# Patient Record
Sex: Female | Born: 1955 | Race: White | Hispanic: No | Marital: Married | Smoking: Former smoker
Health system: Southern US, Community
[De-identification: ages and names within clinical notes are randomized; demographics above are authoritative.]

## PROBLEM LIST (undated history)

## (undated) DIAGNOSIS — I471 Supraventricular tachycardia, unspecified: Secondary | ICD-10-CM

## (undated) DIAGNOSIS — G8929 Other chronic pain: Secondary | ICD-10-CM

## (undated) DIAGNOSIS — E039 Hypothyroidism, unspecified: Secondary | ICD-10-CM

## (undated) DIAGNOSIS — M329 Systemic lupus erythematosus, unspecified: Secondary | ICD-10-CM

## (undated) DIAGNOSIS — IMO0002 Reserved for concepts with insufficient information to code with codable children: Secondary | ICD-10-CM

## (undated) DIAGNOSIS — F410 Panic disorder [episodic paroxysmal anxiety] without agoraphobia: Secondary | ICD-10-CM

## (undated) DIAGNOSIS — M810 Age-related osteoporosis without current pathological fracture: Secondary | ICD-10-CM

## (undated) HISTORY — DX: Supraventricular tachycardia: I47.1

## (undated) HISTORY — DX: Supraventricular tachycardia, unspecified: I47.10

## (undated) HISTORY — DX: Hypothyroidism, unspecified: E03.9

---

## 1996-10-16 HISTORY — PX: LUMBAR LAMINECTOMY: SHX95

## 2000-10-16 HISTORY — PX: CERVICAL FUSION: SHX112

## 2008-05-28 ENCOUNTER — Encounter (INDEPENDENT_AMBULATORY_CARE_PROVIDER_SITE_OTHER): Payer: Self-pay | Admitting: *Deleted

## 2008-05-28 LAB — CONVERTED CEMR LAB: Pap Smear: NORMAL

## 2008-05-29 ENCOUNTER — Encounter (INDEPENDENT_AMBULATORY_CARE_PROVIDER_SITE_OTHER): Payer: Self-pay | Admitting: *Deleted

## 2008-06-05 ENCOUNTER — Ambulatory Visit: Payer: Self-pay | Admitting: *Deleted

## 2008-06-05 DIAGNOSIS — M1990 Unspecified osteoarthritis, unspecified site: Secondary | ICD-10-CM | POA: Insufficient documentation

## 2008-06-05 DIAGNOSIS — E039 Hypothyroidism, unspecified: Secondary | ICD-10-CM | POA: Insufficient documentation

## 2008-06-05 DIAGNOSIS — F341 Dysthymic disorder: Secondary | ICD-10-CM | POA: Insufficient documentation

## 2008-06-05 DIAGNOSIS — F411 Generalized anxiety disorder: Secondary | ICD-10-CM | POA: Insufficient documentation

## 2008-06-05 DIAGNOSIS — J309 Allergic rhinitis, unspecified: Secondary | ICD-10-CM | POA: Insufficient documentation

## 2008-06-05 DIAGNOSIS — E785 Hyperlipidemia, unspecified: Secondary | ICD-10-CM | POA: Insufficient documentation

## 2008-06-05 DIAGNOSIS — K219 Gastro-esophageal reflux disease without esophagitis: Secondary | ICD-10-CM | POA: Insufficient documentation

## 2008-06-05 DIAGNOSIS — Z87442 Personal history of urinary calculi: Secondary | ICD-10-CM | POA: Insufficient documentation

## 2008-06-05 DIAGNOSIS — I10 Essential (primary) hypertension: Secondary | ICD-10-CM | POA: Insufficient documentation

## 2008-06-05 DIAGNOSIS — G894 Chronic pain syndrome: Secondary | ICD-10-CM | POA: Insufficient documentation

## 2008-06-05 DIAGNOSIS — F329 Major depressive disorder, single episode, unspecified: Secondary | ICD-10-CM | POA: Insufficient documentation

## 2008-06-05 DIAGNOSIS — N259 Disorder resulting from impaired renal tubular function, unspecified: Secondary | ICD-10-CM | POA: Insufficient documentation

## 2008-06-08 ENCOUNTER — Telehealth (INDEPENDENT_AMBULATORY_CARE_PROVIDER_SITE_OTHER): Payer: Self-pay | Admitting: *Deleted

## 2008-10-19 ENCOUNTER — Ambulatory Visit: Payer: Self-pay | Admitting: *Deleted

## 2008-10-19 DIAGNOSIS — K5900 Constipation, unspecified: Secondary | ICD-10-CM | POA: Insufficient documentation

## 2008-10-19 DIAGNOSIS — D509 Iron deficiency anemia, unspecified: Secondary | ICD-10-CM | POA: Insufficient documentation

## 2008-10-19 LAB — CONVERTED CEMR LAB
ALT: 15 units/L (ref 0–35)
Albumin: 3.6 g/dL (ref 3.5–5.2)
Alkaline Phosphatase: 101 units/L (ref 39–117)
CO2: 31 meq/L (ref 19–32)
GFR calc non Af Amer: 81 mL/min
Glucose, Bld: 106 mg/dL — ABNORMAL HIGH (ref 70–99)
Potassium: 3.8 meq/L (ref 3.5–5.1)
Sodium: 139 meq/L (ref 135–145)
Total Protein: 6.4 g/dL (ref 6.0–8.3)

## 2008-11-16 ENCOUNTER — Telehealth (INDEPENDENT_AMBULATORY_CARE_PROVIDER_SITE_OTHER): Payer: Self-pay | Admitting: *Deleted

## 2008-12-24 ENCOUNTER — Telehealth (INDEPENDENT_AMBULATORY_CARE_PROVIDER_SITE_OTHER): Payer: Self-pay | Admitting: *Deleted

## 2009-02-26 ENCOUNTER — Ambulatory Visit: Payer: Self-pay | Admitting: Internal Medicine

## 2009-02-26 DIAGNOSIS — L0390 Cellulitis, unspecified: Secondary | ICD-10-CM

## 2009-02-26 DIAGNOSIS — L0291 Cutaneous abscess, unspecified: Secondary | ICD-10-CM | POA: Insufficient documentation

## 2009-02-26 DIAGNOSIS — L259 Unspecified contact dermatitis, unspecified cause: Secondary | ICD-10-CM | POA: Insufficient documentation

## 2009-03-01 ENCOUNTER — Encounter: Payer: Self-pay | Admitting: Internal Medicine

## 2009-03-12 ENCOUNTER — Telehealth: Payer: Self-pay | Admitting: Internal Medicine

## 2009-03-23 ENCOUNTER — Ambulatory Visit: Payer: Self-pay | Admitting: Internal Medicine

## 2009-06-03 ENCOUNTER — Encounter: Payer: Self-pay | Admitting: Internal Medicine

## 2009-06-04 ENCOUNTER — Encounter: Payer: Self-pay | Admitting: Internal Medicine

## 2009-06-09 ENCOUNTER — Encounter: Payer: Self-pay | Admitting: Internal Medicine

## 2009-06-11 ENCOUNTER — Encounter: Payer: Self-pay | Admitting: Internal Medicine

## 2009-06-15 ENCOUNTER — Telehealth: Payer: Self-pay | Admitting: Internal Medicine

## 2009-06-15 ENCOUNTER — Ambulatory Visit: Payer: Self-pay | Admitting: Radiology

## 2009-06-15 ENCOUNTER — Ambulatory Visit: Payer: Self-pay | Admitting: Internal Medicine

## 2009-06-15 ENCOUNTER — Ambulatory Visit (HOSPITAL_BASED_OUTPATIENT_CLINIC_OR_DEPARTMENT_OTHER): Admission: RE | Admit: 2009-06-15 | Discharge: 2009-06-15 | Payer: Self-pay | Admitting: Internal Medicine

## 2009-06-23 ENCOUNTER — Telehealth: Payer: Self-pay | Admitting: Internal Medicine

## 2009-10-06 ENCOUNTER — Encounter (INDEPENDENT_AMBULATORY_CARE_PROVIDER_SITE_OTHER): Payer: Self-pay | Admitting: *Deleted

## 2009-10-22 ENCOUNTER — Encounter (INDEPENDENT_AMBULATORY_CARE_PROVIDER_SITE_OTHER): Payer: Self-pay | Admitting: *Deleted

## 2010-11-15 NOTE — Letter (Signed)
Summary: Pre Visit No Show Letter  [facility]  [address] [place], [place] [address]   Phone: [phone]  Fax: [phone]        October 22, 2009 MRN: [id number]    [facility] [address] [place], [place]  [address]    Dear Ms. Warnick,   We have been unable to reach you by phone concerning the pre-procedure visit that you missed on 10/22/09. For this reason,your procedure scheduled on 11/05/09 has been cancelled. Our scheduling staff will gladly assist you with rescheduling your appointments at a more convenient time. Please call our office at [phone] between the hours of 8:00am and 5:00pm, press option #2 to reach an appointment scheduler. Please consider updating your contact numbers at this time so that we can reach you by phone in the future with schedule changes or results.    Thank you,    Wyona Almas RN [facility]

## 2013-08-27 ENCOUNTER — Ambulatory Visit (INDEPENDENT_AMBULATORY_CARE_PROVIDER_SITE_OTHER): Payer: Managed Care, Other (non HMO) | Admitting: Neurology

## 2013-08-27 ENCOUNTER — Encounter: Payer: Self-pay | Admitting: Neurology

## 2013-08-27 VITALS — BP 132/80 | HR 88 | Temp 98.0°F | Ht 65.0 in | Wt 147.7 lb

## 2013-08-27 DIAGNOSIS — M542 Cervicalgia: Secondary | ICD-10-CM

## 2013-08-27 NOTE — Patient Instructions (Addendum)
1.  MRI cervical spine wo contrast-Thursday November 20,2015 at 12:45 to arrive at 12:30 at [facility] [id number]. 2.  Recommended pain management for symptomatic control since she has been on a number of medications without benefit 3.  Physical therapy recommended, but patient deferred  4.  Return to clinic in 4-months

## 2013-08-27 NOTE — Progress Notes (Signed)
[facility] Clinic Note - Initial Visit   Date: 08/27/2013    Julia Stout MRN: [id number] DOB: 07/24/1956   Dear Dr Aundria Rud:  Thank you for your kind referral of Julia Stout for consultation of back and neck pain. Although her history is well known to you, please allow [place] to reiterate it for the purpose of our medical record. The patient was accompanied to the clinic by self.   History of Present Illness: Julia Stout is a 57 y.o. year-old right-handed Caucasian female with history of hypothyroidism,SVT s/p ablation, cervical fusion at C5-6 and C6-7 (2001) and lumbar laminectomy (1999) presenting for evaluation of neck pain.   She tells me that she has always had neck pain and in 2001, had a double cervical fusion at C5-6 and C6-7, but had hardware removed a few weeks later because of complications related to a vertebral fracture.  Following surgery, there was mild improvement in pain and she and was able to work go back to work while taking flexiril and vicodin, but went on disability in 2004 because of persistent neck pain. She saw a number of providers because of worsening pain.  Her last spine imaging from 2007 showed post-surgical changes and moderate foraminal narrowing at C4-5 (left) and C3-4 and C5-6 (right).  EMG in 2007 was normal.  Since then, she had been managing her pain with oxycodone but in 2008 started developing open lesions of her face.  She has been to over 20 physicians for these lesions and was placed on antibiotics. She was told she was "erupting oxycodone out of her face" so stopped it.  Since then, nothing has helped with her pain.     Currently, she has intermittent neck pain, described as sharp and achy.  Pain is worsened by any type of activity such as dusting, vacuuming exacerbate symptoms.  Currently, she takes lyrica and tramadol for pain.  Previously, she has taking oxycodone, neurontin, advil, cymbalta, flexiril and had a number  of steroid injections.  She has been to neurologist Dr. Merita Norton in [place], Dr. Jamse Belfast at [facility], Dr. Alois Cliche at [facility], and currently follows with her PCP, Dr. Osborne Oman at [facility].  For pain management, she is taking ultram, advil, and Lyrica 200mg  BID.  She had done physical therapy in the past but said it made her worse.  Out-side paper records, electronic medical record, and images have been reviewed where available and summarized as: EMG 2007:  Normal study of the right side and left lower extremity.  MRI lumbar spine wwo contrast 01/16/2006: 1.  S/p midline L5 laminectomy.  At L5-S1, there is degenerative disc changes without focal herniation. 2.  At the superior anterior border of the L3 vertebral body, there are mild signal changes with peripheral enhancement, but no significant degenerative disc changes.  Findings likely represent subchondral reactive changes   MRI thoracic spine wwo contrast 01/16/2006 Normal MRI of the thoracic spine  MRI cervical spine wwo contrast 01/16/2006: 1.  S/p stable C6-7 spinal fusion 2.  At C5-6, vertebral and intervertebral disc space changes most suggestive surgical diskectomy at that site.   3.  Degenerative bone and disc changes present at C3-C4, C4-5, and C5-6.  There changes results in mild central canal stenosis and moderate left foraminal narrowing at C4-5 as well as moderate right foraminal narrowing at C3-4 and C5-6. 4.  No focal disc herniations.   Past Medical History  Diagnosis Date  . SVT (supraventricular  tachycardia)   . Hypothyroid     Past Surgical History  Procedure Laterality Date  . Lumbar laminectomy  1998    L5-S1  . Cervical fusion  2002     Medications:  No current outpatient prescriptions on file prior to visit.   No current facility-administered medications on file prior to visit.    Allergies:  Allergies  Allergen Reactions  . Neomycin-Bacitracin  Zn-Polymyx     REACTION: Blsiters  . Penicillins   . Sulfonamide Derivatives     Family History: Family History  Problem Relation Age of Onset  . Hypertension Mother     Living, 80  . Arthritis Father     Living, 30  . Bowel Disease Sister     Died, 31 from SBO  . Crohn's disease Brother     Living, 59    Social History: History   Social History  . Marital Status: Married    Spouse Name: N/A    Number of Children: N/A  . Years of Education: N/A   Occupational History  . Not on file.   Social History Main Topics  . Smoking status: Former Games developer  . Smokeless tobacco: Never Used  . Alcohol Use: No  . Drug Use: No  . Sexual Activity: Not on file   Other Topics Concern  . Not on file   Social History Narrative   She previously worked as an Charity fundraiser.     She has been on disability since 2004 because of low back pain.   Lives with husband.    Review of Systems:  CONSTITUTIONAL: No fevers, chills, night sweats, or weight loss.   EYES: No visual changes or eye pain ENT: No hearing changes.  No history of nose bleeds.  +neck pain RESPIRATORY: No cough, wheezing and shortness of breath.   CARDIOVASCULAR: Negative for chest pain, and palpitations.   GI: Negative for abdominal discomfort, blood in stools or black stools.  No recent change in bowel habits.   GU:  No history of incontinence.   MUSCLOSKELETAL: No history of joint pain or swelling.  No myalgias.   SKIN: Negative for lesions, rash, and itching.   HEMATOLOGY/ONCOLOGY: Negative for prolonged bleeding, bruising easily, and swollen nodes.  No history of cancer.   ENDOCRINE: Negative for cold or heat intolerance, polydipsia or goiter.   PSYCH:  No depression +anxiety symptoms.   NEURO: As Above.   Vital Signs:  BP 132/80  Pulse 88  Temp(Src) 98 F (36.7 C) (Oral)  Ht 5\' 5"  (1.651 m)  Wt 147 lb 11.2 oz (66.996 kg)  BMI 24.58 kg/m2  Neurological Exam: MENTAL STATUS including orientation to time, place,  person, recent and remote memory, attention span and concentration, language, and fund of knowledge is normal.  Speech is not dysarthric.  CRANIAL NERVES: II:  No visual field defects.  Unremarkable fundi.   III-IV-VI: Pupils equal round and reactive to light.  Normal conjugate, extra-ocular eye movements in all directions of gaze.  No nystagmus.  No ptosis.   V:  Normal facial sensation.   VII:  Normal facial symmetry and movements.  VIII:  Normal hearing and vestibular function.   IX-X:  Normal palatal movement.   XI:  Normal shoulder shrug and head rotation.   XII:  Normal tongue strength and range of motion, no deviation or fasciculation.  MOTOR:  No atrophy, fasciculations or abnormal movements.  No pronator drift.  Tone is normal.    Right Upper Extremity:  Left Upper Extremity:    Deltoid  5/5   Deltoid  5/5   Biceps  5/5   Biceps  5/5   Triceps  5/5   Triceps  5/5   Wrist extensors  5/5   Wrist extensors  5/5   Wrist flexors  5/5   Wrist flexors  5/5   Finger extensors  5/5   Finger extensors  5/5   Finger flexors  5/5   Finger flexors  5/5   Dorsal interossei  5/5   Dorsal interossei  5/5   Abductor pollicis  5/5   Abductor pollicis  5/5   Tone (Ashworth scale)  0  Tone (Ashworth scale)  0   Right Lower Extremity:    Left Lower Extremity:    Hip flexors  5/5   Hip flexors  5/5   Hip extensors  5/5   Hip extensors  5/5   Knee flexors  5/5   Knee flexors  5/5   Knee extensors  5/5   Knee extensors  5/5   Dorsiflexors  5/5   Dorsiflexors  5/5   Plantarflexors  5/5   Plantarflexors  5/5   Toe extensors  5/5   Toe extensors  5/5   Toe flexors  5/5   Toe flexors  5/5   Tone (Ashworth scale)  0  Tone (Ashworth scale)  0   MSRs:  Right                                                                 Left brachioradialis 2+  brachioradialis 2+  biceps 2+  biceps 2+  triceps 2+  triceps 2+  patellar 2+  patellar 2+  ankle jerk 2+  ankle jerk 2+  Hoffman no  Hoffman no    plantar response down  plantar response down  No clonus.  SENSORY:  Patchy area of diminished pin prick over the right lateral lower leg, otherwise normal and symmetric perception of light touch, vibration, and proprioception.  Romberg's sign absent.   COORDINATION/GAIT: Normal finger-to- nose-finger and heel-to-shin.  Intact rapid alternating movements bilaterally.  Able to rise from a chair without using arms.  Gait narrow based and stable. Slightly unsteady with tandem and stressed gait intact.    IMPRESSION: Ms. Eugenio is a 57 year-old female presenting for evaluation of chronic neck pain.  She had no weakness on exam and only a discrete area of reduced pin prick over the right lateral leg.  I have no images to review, but her MRI report from 2007 indicate post-surgical changes and moderate foraminal narrowing at C4-5 (left) and C3-4 and C5-6 (right).  I discussed that given her worsening pain, we can reassess the degree of nerve impingement with repeat imaging.  Further, EMG of the upper extremities can be performed to better localize and determine the severity of nerve involvement.  She is not interesting in a EMG, but is very interested in repeat imaging of her neck.   PLAN/RECOMMENDATIONS:  1.  MRI cervical spine wo contrast 2.  Physical therapy recommended, but patient declined 3.  Return to clinic in 37-months  The duration of this appointment visit was 40 minutes of face-to-face time with the patient.  Greater than 50% of this time was spent in  counseling, explanation of diagnosis, planning of further management, and coordination of care.   Thank you for allowing me to participate in patient's care.  If I can answer any additional questions, I would be pleased to do so.    Sincerely,    Salwa Bai K. Posey Pronto, DO

## 2013-09-04 ENCOUNTER — Inpatient Hospital Stay: Admission: RE | Admit: 2013-09-04 | Payer: Managed Care, Other (non HMO) | Source: Ambulatory Visit

## 2013-09-09 ENCOUNTER — Telehealth: Payer: Self-pay | Admitting: Neurology

## 2013-09-09 ENCOUNTER — Encounter: Payer: Self-pay | Admitting: Neurology

## 2013-09-09 ENCOUNTER — Other Ambulatory Visit: Payer: Self-pay | Admitting: *Deleted

## 2013-09-09 DIAGNOSIS — M542 Cervicalgia: Secondary | ICD-10-CM

## 2013-09-09 NOTE — Telephone Encounter (Signed)
Pt calling back, are we going to start PT? See previous telephone message / Roanna Raider

## 2013-09-09 NOTE — Telephone Encounter (Signed)
Pt called the office yesterday and spoke w/ Annabelle Harman. Pt says she wants a MRI and is upset b/c her insurance will not approve the procedure. Pt repeatedly cursed towards [place]. Pt states she has been doing PT since she was 57 yrs old and it doesn't help. Throughout the phone call, the patient continued to curse. Annabelle Harman asked the patient to refrain from using this type of language on the phone. In speaking with Dr. Allena Katz, Dr. Allena Katz has advised this patient will be dismissed from our practice due to the behavior and language exhibited towards our staff on the phone. I have sent a Patient Dismissal Form to Dr. Allena Katz for review. Once she has approved and returned the form to me, I will process the dismissal / Oneita Kras. [facility]

## 2013-09-09 NOTE — Telephone Encounter (Signed)
Referral made [facility] outpatient PT department

## 2013-09-09 NOTE — Telephone Encounter (Signed)
Patient verbally abuse with front staff over the phone on 11/24.  She called back yesterday and today apologizing for her behavior and is now interested in doing PT.   I will continue to see her for now as long as she understands that our office will not tolerate unprofessional behavior and abusive language to staff member.  If she does not comply, she will be discharged from [facility].  For now, we will arrange for her to be seen for PT.  Donika K. Allena Katz, DO

## 2013-09-10 NOTE — Telephone Encounter (Signed)
Spoke with patient.

## 2013-09-10 NOTE — Telephone Encounter (Signed)
LM for pt to call for appt / Sherri

## 2013-09-10 NOTE — Telephone Encounter (Signed)
Spoke w/ pt, says she would like appt sooner than Jan. She says she is having a lot of pain. This sounds like a different pain than she was initially seen here for in the past. Alcario Drought - you may want to call her and get additional details for Dr. Allena Katz regarding current pain. We do not have immediate openings w/ Dr. Allena Katz. Please call / Sherri S.

## 2013-09-23 ENCOUNTER — Telehealth: Payer: Self-pay

## 2013-09-23 NOTE — Telephone Encounter (Signed)
Pt called to discuss a couple of issues going on with her. First she stated that " Fluid is coming out of her face, ears and an old incision on her neck. Pouring out." She is very upset and scared by this and feels that something is very wrong with her. Secondly, she said that she can not do outpatient PT because she can't drive or leave her house. I told her we can put in an order for home health PT but she was very reluctant and said " I know all the moves they will tell me to do and it doesn't help me." she also said that the condition she is in right now with the fluid leakage she is unable to do PT. Thirdly, she said that she " wants to get a referral for a different Neurologist that is an old man." And lastly, she said that she had given Dr.Patel a document that was not returned to her. We are unaware of this document. Pt is going to call her PCP to get a new referral for a different Neurologist.

## 2013-11-04 ENCOUNTER — Ambulatory Visit: Payer: Managed Care, Other (non HMO) | Admitting: Neurology

## 2013-11-07 ENCOUNTER — Telehealth: Payer: Self-pay | Admitting: Neurology

## 2013-11-07 NOTE — Telephone Encounter (Signed)
Patient dismissed from [facility] by Narda Amber D.O. , effective September 09, 2013. Dismissal letter sent out by certified / registered mail. DAJ

## 2014-07-04 ENCOUNTER — Emergency Department (HOSPITAL_BASED_OUTPATIENT_CLINIC_OR_DEPARTMENT_OTHER)
Admission: EM | Admit: 2014-07-04 | Discharge: 2014-07-04 | Disposition: A | Discharge status: Home / Self Care | Payer: Managed Care, Other (non HMO) | Attending: Emergency Medicine | Admitting: Emergency Medicine

## 2014-07-04 ENCOUNTER — Encounter (HOSPITAL_BASED_OUTPATIENT_CLINIC_OR_DEPARTMENT_OTHER): Payer: Self-pay | Admitting: Emergency Medicine

## 2014-07-04 ENCOUNTER — Emergency Department (HOSPITAL_BASED_OUTPATIENT_CLINIC_OR_DEPARTMENT_OTHER): Payer: Managed Care, Other (non HMO)

## 2014-07-04 DIAGNOSIS — S72109A Unspecified trochanteric fracture of unspecified femur, initial encounter for closed fracture: Secondary | ICD-10-CM | POA: Diagnosis not present

## 2014-07-04 DIAGNOSIS — Y929 Unspecified place or not applicable: Secondary | ICD-10-CM | POA: Diagnosis not present

## 2014-07-04 DIAGNOSIS — S199XXA Unspecified injury of neck, initial encounter: Secondary | ICD-10-CM | POA: Diagnosis present

## 2014-07-04 DIAGNOSIS — Y9389 Activity, other specified: Secondary | ICD-10-CM | POA: Insufficient documentation

## 2014-07-04 DIAGNOSIS — W010XXA Fall on same level from slipping, tripping and stumbling without subsequent striking against object, initial encounter: Secondary | ICD-10-CM | POA: Insufficient documentation

## 2014-07-04 DIAGNOSIS — IMO0002 Reserved for concepts with insufficient information to code with codable children: Secondary | ICD-10-CM | POA: Diagnosis not present

## 2014-07-04 DIAGNOSIS — Z79899 Other long term (current) drug therapy: Secondary | ICD-10-CM | POA: Diagnosis not present

## 2014-07-04 DIAGNOSIS — S0993XA Unspecified injury of face, initial encounter: Secondary | ICD-10-CM | POA: Diagnosis present

## 2014-07-04 DIAGNOSIS — S72112A Displaced fracture of greater trochanter of left femur, initial encounter for closed fracture: Secondary | ICD-10-CM

## 2014-07-04 DIAGNOSIS — E039 Hypothyroidism, unspecified: Secondary | ICD-10-CM | POA: Diagnosis not present

## 2014-07-04 DIAGNOSIS — Z87891 Personal history of nicotine dependence: Secondary | ICD-10-CM | POA: Diagnosis not present

## 2014-07-04 DIAGNOSIS — Z880 Allergy status to penicillin: Secondary | ICD-10-CM | POA: Insufficient documentation

## 2014-07-04 DIAGNOSIS — Z792 Long term (current) use of antibiotics: Secondary | ICD-10-CM | POA: Insufficient documentation

## 2014-07-04 DIAGNOSIS — Z8679 Personal history of other diseases of the circulatory system: Secondary | ICD-10-CM | POA: Insufficient documentation

## 2014-07-04 MED ORDER — HYDROMORPHONE HCL 1 MG/ML IJ SOLN
1.0000 mg | Freq: Once | INTRAMUSCULAR | Status: AC
  Administered 2014-07-04: 1 mg via INTRAMUSCULAR
  Filled 2014-07-04: qty 1

## 2014-07-04 MED ORDER — SODIUM CHLORIDE 0.9 % IV SOLN: Freq: Once | INTRAVENOUS | Status: DC

## 2014-07-04 MED ORDER — HYDROMORPHONE HCL 1 MG/ML IJ SOLN
0.5000 mg | Freq: Once | INTRAMUSCULAR | Status: AC
  Administered 2014-07-04: 0.5 mg via INTRAMUSCULAR
  Filled 2014-07-04: qty 1

## 2014-07-04 MED ORDER — HYDROCODONE-ACETAMINOPHEN 5-325 MG PO TABS
1.0000 | ORAL_TABLET | ORAL | Status: DC | PRN
Start: 2014-07-04 — End: 2016-04-15

## 2014-07-04 MED ORDER — HYDROMORPHONE HCL 1 MG/ML IJ SOLN: 1.0000 mg | Freq: Once | INTRAMUSCULAR | Status: DC

## 2014-07-04 NOTE — Consult Note (Signed)
Called by PA at med center [place] about patient S/p fall yesterday with hip pain and difficulty ambulating CT scan results noted No evidence of femoral neck/intertroch disruption Patient by report is NVI, no other complaints.  Closed injury Plan:  NWB  F/u this week with my partner Dr Dois Davenport for ongoing non-operative fracture management  Pain control per ER staff

## 2014-07-04 NOTE — Discharge Instructions (Signed)
IT IS ESSENTIAL THAT YOU REMAIN NON-WEIGHT BEARING TO THE LEFT HIP AT ALL TIMES. FOLLOW UP WITH DR. Gladstone Lighter NEXT WEEK FOR FURTHER MANAGEMENT OF HIP INJURY. RETURN TO THE ED AS NEEDED.   Cryotherapy Cryotherapy means treatment with cold. Ice or gel packs can be used to reduce both pain and swelling. Ice is the most helpful within the first 24 to 48 hours after an injury or flare-up from overusing a muscle or joint. Sprains, strains, spasms, burning pain, shooting pain, and aches can all be eased with ice. Ice can also be used when recovering from surgery. Ice is effective, has very few side effects, and is safe for most people to use. PRECAUTIONS  Ice is not a safe treatment option for people with:  Raynaud phenomenon. This is a condition affecting small blood vessels in the extremities. Exposure to cold may cause your problems to return.  Cold hypersensitivity. There are many forms of cold hypersensitivity, including:  Cold urticaria. Red, itchy hives appear on the skin when the tissues begin to warm after being iced.  Cold erythema. This is a red, itchy rash caused by exposure to cold.  Cold hemoglobinuria. Red blood cells break down when the tissues begin to warm after being iced. The hemoglobin that carry oxygen are passed into the urine because they cannot combine with blood proteins fast enough.  Numbness or altered sensitivity in the area being iced. If you have any of the following conditions, do not use ice until you have discussed cryotherapy with your caregiver:  Heart conditions, such as arrhythmia, angina, or chronic heart disease.  High blood pressure.  Healing wounds or open skin in the area being iced.  Current infections.  Rheumatoid arthritis.  Poor circulation.  Diabetes. Ice slows the blood flow in the region it is applied. This is beneficial when trying to stop inflamed tissues from spreading irritating chemicals to surrounding tissues. However, if you expose  your skin to cold temperatures for too long or without the proper protection, you can damage your skin or nerves. Watch for signs of skin damage due to cold. HOME CARE INSTRUCTIONS Follow these tips to use ice and cold packs safely.  Place a dry or damp towel between the ice and skin. A damp towel will cool the skin more quickly, so you may need to shorten the time that the ice is used.  For a more rapid response, add gentle compression to the ice.  Ice for no more than 10 to 20 minutes at a time. The bonier the area you are icing, the less time it will take to get the benefits of ice.  Check your skin after 5 minutes to make sure there are no signs of a poor response to cold or skin damage.  Rest 20 minutes or more between uses.  Once your skin is numb, you can end your treatment. You can test numbness by very lightly touching your skin. The touch should be so light that you do not see the skin dimple from the pressure of your fingertip. When using ice, most people will feel these normal sensations in this order: cold, burning, aching, and numbness.  Do not use ice on someone who cannot communicate their responses to pain, such as small children or people with dementia. HOW TO MAKE AN ICE PACK Ice packs are the most common way to use ice therapy. Other methods include ice massage, ice baths, and cryosprays. Muscle creams that cause a cold, tingly feeling do not  offer the same benefits that ice offers and should not be used as a substitute unless recommended by your caregiver. To make an ice pack, do one of the following:  Place crushed ice or a bag of frozen vegetables in a sealable plastic bag. Squeeze out the excess air. Place this bag inside another plastic bag. Slide the bag into a pillowcase or place a damp towel between your skin and the bag.  Mix 3 parts water with 1 part rubbing alcohol. Freeze the mixture in a sealable plastic bag. When you remove the mixture from the freezer, it  will be slushy. Squeeze out the excess air. Place this bag inside another plastic bag. Slide the bag into a pillowcase or place a damp towel between your skin and the bag. SEEK MEDICAL CARE IF:  You develop white spots on your skin. This may give the skin a blotchy (mottled) appearance.  Your skin turns blue or pale.  Your skin becomes waxy or hard.  Your swelling gets worse. MAKE SURE YOU:   Understand these instructions.  Will watch your condition.  Will get help right away if you are not doing well or get worse. Document Released: 05/29/2011 Document Revised: 02/16/2014 Document Reviewed: 05/29/2011 [facility] Patient Information 2015 [place], [place]. This information is not intended to replace advice given to you by your health care provider. Make sure you discuss any questions you have with your health care provider. Hip Fracture A hip fracture is a fracture of the upper part of your thigh bone (femur).  CAUSES A hip fracture is caused by a direct blow to the side of your hip. This is usually the result of a fall but can occur in other circumstances, such as an automobile accident. RISK FACTORS There is an increased risk of hip fractures in people with:  An unsteady walking pattern (gait) and those with conditions that contribute to poor balance, such as Parkinson's disease or dementia.  Osteopenia and osteoporosis.  Cancer that spreads to the leg bones.  Certain metabolic diseases. SYMPTOMS  Symptoms of hip fracture include:  Pain over the injured hip.  Inability to put weight on the leg in which the fracture occurred (although, some patients are able to walk after a hip fracture).  Toes and foot of the affected leg point outward when you lie down. DIAGNOSIS A physical exam can determine if a hip fracture is likely to have occurred. X-ray exams are needed to confirm the fracture and to look for other injuries. The X-ray exam can help to determine the type of hip  fracture. Rarely, the fracture is not visible on an X-ray image and a CT scan or MRI will have to be done. TREATMENT  The treatment for a fracture is usually surgery. This means using a screw, nail, or rod to hold the bones in place.  HOME CARE INSTRUCTIONS Take all medicines as directed by your health care provider. SEEK MEDICAL CARE IF: Pain continues, even after taking pain medicine. MAKE SURE YOU:  Understand these instructions.   Will watch your condition.  Will get help right away if you are not doing well or get worse. Document Released: 10/02/2005 Document Revised: 10/07/2013 Document Reviewed: 05/14/2013 [facility] Patient Information 2015 [place], [place]. This information is not intended to replace advice given to you by your health care provider. Make sure you discuss any questions you have with your health care provider.

## 2014-07-04 NOTE — ED Notes (Signed)
Patient here after slipping and falling onto left side last pm landing on hardwood floor. No loc. Complains of lower back pain, left hip, left knee and having trouble rotating to sit or walk.

## 2014-07-04 NOTE — ED Notes (Signed)
Patient transported to CT 

## 2014-07-04 NOTE — ED Provider Notes (Signed)
CSN: [id number]     Arrival date & time 07/04/14  1055 History   First MD Initiated Contact with Patient 07/04/14 1200     Chief Complaint  Patient presents with  . Fall     (Consider location/radiation/quality/duration/timing/severity/associated sxs/prior Treatment) Patient is a 58 y.o. female presenting with fall. The history is provided by the patient. No language interpreter was used.  Fall This is a new problem. The current episode started yesterday. Pertinent negatives include no abdominal pain, chest pain, chills, fever, headaches or numbness. Associated symptoms comments: Pain in left thigh, pelvis and lower back since fall yesterday. Reports difficulty with moving, walking. Denies head injury, chest/abdominal pain, or other extremity pain. .    Past Medical History  Diagnosis Date  . SVT (supraventricular tachycardia)   . Hypothyroid    Past Surgical History  Procedure Laterality Date  . Lumbar laminectomy  1998    L5-S1  . Cervical fusion  2002   Family History  Problem Relation Age of Onset  . Hypertension Mother     Living, 64  . Arthritis Father     Living, 50  . Bowel Disease Sister     Died, 46 from SBO  . Crohn's disease Brother     Living, 2   History  Substance Use Topics  . Smoking status: Former Research scientist (life sciences)  . Smokeless tobacco: Never Used  . Alcohol Use: No   OB History   Grav Para Term Preterm Abortions TAB SAB Ect Mult Living                 Review of Systems  Constitutional: Negative for fever and chills.  Respiratory: Negative.  Negative for shortness of breath.   Cardiovascular: Negative.  Negative for chest pain.  Gastrointestinal: Negative.  Negative for abdominal pain.  Musculoskeletal:       See HPI.  Skin: Negative.  Negative for wound.  Neurological: Negative.  Negative for numbness and headaches.      Allergies  Neomycin-bacitracin zn-polymyx; Penicillins; Sulfonamide derivatives; and Oxycodone  Home Medications   Prior to  Admission medications   Medication Sig Start Date End Date Taking? Authorizing Provider  azithromycin (ZITHROMAX) 500 MG tablet Take by mouth daily.   Yes Historical Provider, MD  loratadine (CLARITIN) 10 MG tablet Take 10 mg by mouth daily.   Yes Historical Provider, MD  ALPRAZolam Duanne Moron) 1 MG tablet Take 1 mg by mouth 3 (three) times daily as needed for anxiety.    Historical Provider, MD  bumetanide (BUMEX) 1 MG tablet Take 1 mg by mouth 2 (two) times daily.    Historical Provider, MD  cyclobenzaprine (FLEXERIL) 10 MG tablet Take 10 mg by mouth daily.    Historical Provider, MD  levothyroxine (SYNTHROID, LEVOTHROID) 112 MCG tablet Take 112 mcg by mouth daily before breakfast.    Historical Provider, MD  omeprazole (PRILOSEC) 20 MG capsule Take 20 mg by mouth 2 (two) times daily before a meal.    Historical Provider, MD  pregabalin (LYRICA) 200 MG capsule Take 200 mg by mouth 2 (two) times daily.    Historical Provider, MD   BP 101/68  Pulse 58  Temp(Src) 97.5 F (36.4 C) (Oral)  Resp 20  Ht 5\' 6"  (1.676 m)  Wt 146 lb (66.225 kg)  BMI 23.58 kg/m2  SpO2 98% Physical Exam  Constitutional: She is oriented to person, place, and time. She appears well-developed and well-nourished.  Neck: Normal range of motion.  Cardiovascular: Intact distal pulses.  Pulmonary/Chest: Effort normal. She exhibits no tenderness.  Abdominal: There is no tenderness.  Musculoskeletal:  Left thigh tenderness without bony deformity or bruising to extremity. No palpable hip tenderness. Knee and lower leg non-tender. Midline and para-lumbar tenderness without swelling. Right LE non-tender with FROM.   Neurological: She is alert and oriented to person, place, and time.  Skin: Skin is warm and dry.    ED Course  Procedures (including critical care time) Labs Review Labs Reviewed - No data to display  Imaging Review No results found.   EKG Interpretation None     Dg Chest 2 View  07/04/2014   CLINICAL  DATA:  Preoperative prior to hip replacement.  EXAM: CHEST  2 VIEW  COMPARISON:  PA and lateral chest x-ray of June 15, 2009  FINDINGS: The lungs are slightly less well inflated today. There is no focal infiltrate. The heart and pulmonary vascularity are within the limits of normal. There is no pleural effusion. The bony thorax is unremarkable.  IMPRESSION: There is no acute cardiopulmonary abnormality.   Electronically Signed   By: David  Martinique   On: 07/04/2014 15:27   Dg Lumbar Spine Complete  07/04/2014   CLINICAL DATA:  Low back and left hip pain following a fall last night.  EXAM: LUMBAR SPINE - COMPLETE 4+ VIEW  COMPARISON:  None.  FINDINGS: Five non-rib-bearing lumbar vertebrae. Marked disc space narrowing at the L1-2 level with anterior and lateral spur formation. Marked disc space narrowing with moderate posterior spur formation at the L5-S1 level. Facet degenerative changes in the mid and lower lumbar spine. No fractures, pars defects or subluxations are seen. Cholecystectomy clips.  IMPRESSION: 1. No fracture or subluxation. 2. Degenerative changes, as described above.   Electronically Signed   By: Enrique Sack M.D.   On: 07/04/2014 13:38   Dg Pelvis 1-2 Views  07/04/2014   CLINICAL DATA:  Fall.  Left hip pain laterally.  EXAM: PELVIS - 1-2 VIEW  COMPARISON:  None.  FINDINGS: As described on concurrent femur radiographs, there is a linear lucency traversing cortex in the greater trochanter left femur which could reflect a nondisplaced fracture. Left hip joint space narrowing is again noted with a well corticated ossicle at the superolateral margin of the acetabulum. There is also mild-to-moderate right hip joint space narrowing. Right hip is located on this single projection. Benign-appearing sclerotic lesion is present in the proximal shaft of the left femur. No soft tissue abnormality is seen.  IMPRESSION: 1. Possible nondisplaced fracture involving the greater trochanter of the left femur. 2.  Left greater than right hip osteoarthrosis.   Electronically Signed   By: Logan Bores   On: 07/04/2014 13:35   Dg Femur Left  07/04/2014   CLINICAL DATA:  Fall. Proximal lateral left femur pain. 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  EXAM: LEFT FEMUR - 2 VIEW  COMPARISON:  None.  FINDINGS: Left hip and knee are located. There is moderate left hip joint space narrowing with marginal osteophyte formation and a well corticated ossicle at the superolateral margin of the acetabulum which may be developmental or related to prior trauma or degenerative change.  There is subtle curvilinear lucency traversing cortex in the greater trochanter, which could reflect nondisplaced fracture. There is a 1.3 cm sclerotic lesion with narrow zone of transition in the proximal shaft of the left femur, likely benign. More distal femur appears intact. No soft tissue abnormality is identified.  IMPRESSION: 1. Question nondisplaced fracture in  the greater trochanter of the left femur. 2. Moderate left hip osteoarthrosis.   Electronically Signed   By: Logan Bores   On: 07/04/2014 13:33   Ct Hip Left Wo Contrast  07/04/2014   CLINICAL DATA:  Left hip pain following a fall today. Questioned fracture of the greater trochanter on radiographs earlier today.  EXAM: CT OF THE LEFT HIP WITHOUT CONTRAST  TECHNIQUE: Multidetector CT imaging of the left hip was performed according to the standard protocol. Multiplanar CT image reconstructions were also generated.  COMPARISON:  Radiographs obtained earlier today.  FINDINGS: Essentially nondisplaced fracture of the left greater trochanter. No medial extension. Left hip joint space narrowing and associated spur formation. No dislocation seen. Irregular calcification in the proximal femoral medullary cavity.  IMPRESSION: 1. Essentially nondisplaced fracture of the left greater trochanter. 2. Moderate left hip degenerative changes. 3. 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   On: 07/04/2014 14:44   MDM   Final diagnoses:  None    1. Left greater trochanter fracture  Discussed finding with Dr. Rolena Infante who reviewed imaging who feels fracture can be treated non-surgically as outpatient. Advises patient can be discharged home, strict non-weight bearing, follow up next week with Dr. Gladstone Lighter. She reports she has a walker at home and would prefer this over crutches. Stressed non-weight bearing status. Family and patient acknowledge and agree.  Dewaine Oats, PA-C 07/04/14 1629

## 2014-07-05 NOTE — ED Provider Notes (Signed)
Medical screening examination/treatment/procedure(s) were performed by non-physician practitioner and as supervising physician I was immediately available for consultation/collaboration.   EKG Interpretation   Date/Time:  Saturday July 04 2014 15:04:52 EDT Ventricular Rate:  61 PR Interval:  140 QRS Duration: 86 QT Interval:  390 QTC Calculation: 392 R Axis:   62 Text Interpretation:  Normal sinus rhythm Normal ECG No prior for   comparison Confirmed by DOCHERTY  MD, MEGAN ([id number]) on 07/04/2014 3:11:30 PM        Ephraim Hamburger, MD 07/05/14 [phone]

## 2014-07-14 ENCOUNTER — Encounter (HOSPITAL_BASED_OUTPATIENT_CLINIC_OR_DEPARTMENT_OTHER): Payer: Self-pay | Admitting: Emergency Medicine

## 2014-07-14 ENCOUNTER — Emergency Department (HOSPITAL_BASED_OUTPATIENT_CLINIC_OR_DEPARTMENT_OTHER): Payer: Managed Care, Other (non HMO)

## 2014-07-14 ENCOUNTER — Emergency Department (HOSPITAL_BASED_OUTPATIENT_CLINIC_OR_DEPARTMENT_OTHER)
Admission: EM | Admit: 2014-07-14 | Discharge: 2014-07-15 | Disposition: A | Discharge status: Home / Self Care | Payer: Managed Care, Other (non HMO) | Attending: Emergency Medicine | Admitting: Emergency Medicine

## 2014-07-14 DIAGNOSIS — Z79899 Other long term (current) drug therapy: Secondary | ICD-10-CM | POA: Diagnosis not present

## 2014-07-14 DIAGNOSIS — G8929 Other chronic pain: Secondary | ICD-10-CM | POA: Diagnosis not present

## 2014-07-14 DIAGNOSIS — E039 Hypothyroidism, unspecified: Secondary | ICD-10-CM | POA: Diagnosis not present

## 2014-07-14 DIAGNOSIS — S72109A Unspecified trochanteric fracture of unspecified femur, initial encounter for closed fracture: Secondary | ICD-10-CM | POA: Insufficient documentation

## 2014-07-14 DIAGNOSIS — Z87891 Personal history of nicotine dependence: Secondary | ICD-10-CM | POA: Diagnosis not present

## 2014-07-14 DIAGNOSIS — Y9389 Activity, other specified: Secondary | ICD-10-CM | POA: Diagnosis not present

## 2014-07-14 DIAGNOSIS — S79929A Unspecified injury of unspecified thigh, initial encounter: Secondary | ICD-10-CM | POA: Insufficient documentation

## 2014-07-14 DIAGNOSIS — S79919A Unspecified injury of unspecified hip, initial encounter: Secondary | ICD-10-CM | POA: Diagnosis present

## 2014-07-14 DIAGNOSIS — Z8679 Personal history of other diseases of the circulatory system: Secondary | ICD-10-CM | POA: Insufficient documentation

## 2014-07-14 DIAGNOSIS — Y9289 Other specified places as the place of occurrence of the external cause: Secondary | ICD-10-CM | POA: Diagnosis not present

## 2014-07-14 DIAGNOSIS — X500XXA Overexertion from strenuous movement or load, initial encounter: Secondary | ICD-10-CM | POA: Diagnosis not present

## 2014-07-14 DIAGNOSIS — Z792 Long term (current) use of antibiotics: Secondary | ICD-10-CM | POA: Diagnosis not present

## 2014-07-14 DIAGNOSIS — D168 Benign neoplasm of pelvic bones, sacrum and coccyx: Secondary | ICD-10-CM | POA: Insufficient documentation

## 2014-07-14 DIAGNOSIS — Z7982 Long term (current) use of aspirin: Secondary | ICD-10-CM | POA: Diagnosis not present

## 2014-07-14 DIAGNOSIS — Z880 Allergy status to penicillin: Secondary | ICD-10-CM | POA: Insufficient documentation

## 2014-07-14 DIAGNOSIS — S72112S Displaced fracture of greater trochanter of left femur, sequela: Secondary | ICD-10-CM

## 2014-07-14 HISTORY — DX: Other chronic pain: G89.29

## 2014-07-14 MED ORDER — HYDROMORPHONE HCL 1 MG/ML IJ SOLN
2.0000 mg | Freq: Once | INTRAMUSCULAR | Status: AC
  Administered 2014-07-15: 2 mg via INTRAMUSCULAR
  Filled 2014-07-14: qty 2

## 2014-07-14 NOTE — ED Provider Notes (Signed)
CSN: [id number]     Arrival date & time 07/14/14  2243 History  This chart was scribed for Wynetta Fines, MD by Lowella Petties, ED Scribe. The patient was seen in room MH08/MH08. Patient's care was started at 11:29 PM.     Chief Complaint  Patient presents with  . Hip Pain   The history is provided by the patient. No language interpreter was used.   HPI Comments: Julia Stout is a 58 y.o. female who presents to the Emergency Department with a left greater trochanter fracture 11 days ago. She reports seeing Orthopedist Dr. Cay Schillings for this. Her husband reports that she was seen here after the fracture occurred, and that the X-Ray done at Dr. Cleatis Polka office showed that the fracture had increased slightly in size. She was instructed to go onto bed rest. She reports that earlier tonight she was raising her left leg with her right leg, and she felt something "pop," followed by pain in her left hip and groin a little bit higher than her left trochanter. She reports that she is currently having excruciating pain in her left groin and left medial thigh. She reports difficulty walking. 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   Past Medical History  Diagnosis Date  . SVT (supraventricular tachycardia)   . Hypothyroid   . Chronic pain    Past Surgical History  Procedure Laterality Date  . Lumbar laminectomy  1998    L5-S1  . Cervical fusion  2002   Family History  Problem Relation Age of Onset  . Hypertension Mother     Living, 43  . Arthritis Father     Living, 15  . Bowel Disease Sister     Died, 17 from SBO  . Crohn's disease Brother     Living, 21   History  Substance Use Topics  . Smoking status: Former Research scientist (life sciences)  . Smokeless tobacco: Never Used  . Alcohol Use: No   OB History   Grav Para Term Preterm Abortions TAB SAB Ect Mult Living                 Review of Systems A complete 10 system review of systems was obtained and all systems are negative except  as noted in the HPI and PMH.   Allergies  Neomycin-bacitracin zn-polymyx; Penicillins; Sulfonamide derivatives; and Oxycodone  Home Medications   Prior to Admission medications   Medication Sig Start Date End Date Taking? Authorizing Provider  ALPRAZolam Duanne Moron) 1 MG tablet Take 1 mg by mouth 3 (three) times daily as needed for anxiety.   Yes Historical Provider, MD  aspirin 81 MG tablet Take 81 mg by mouth daily.   Yes Historical Provider, MD  azithromycin (ZITHROMAX) 500 MG tablet Take by mouth daily.   Yes Historical Provider, MD  bumetanide (BUMEX) 1 MG tablet Take 1 mg by mouth 2 (two) times daily.   Yes Historical Provider, MD  cyclobenzaprine (FLEXERIL) 10 MG tablet Take 10 mg by mouth daily.   Yes Historical Provider, MD  HYDROcodone-acetaminophen (NORCO/VICODIN) 5-325 MG per tablet Take 1-2 tablets by mouth every 4 (four) hours as needed. 07/04/14  Yes Shari A Upstill, PA-C  levothyroxine (SYNTHROID, LEVOTHROID) 112 MCG tablet Take 112 mcg by mouth daily before breakfast.   Yes Historical Provider, MD  loratadine (CLARITIN) 10 MG tablet Take 10 mg by mouth daily.   Yes Historical Provider, MD  omeprazole (PRILOSEC) 20 MG capsule Take 20 mg by mouth 2 (  two) times daily before a meal.   Yes Historical Provider, MD  pregabalin (LYRICA) 200 MG capsule Take 200 mg by mouth 2 (two) times daily.   Yes Historical Provider, MD   Triage Vitals: BP 185/92  Pulse 79  Temp(Src) 97.9 F (36.6 C) (Oral)  Resp 18  Ht 5\' 6"  (1.676 m)  Wt 145 lb (65.772 kg)  BMI 23.41 kg/m2  SpO2 96% Physical Exam  General: Well-developed, well-nourished female in no acute distress; appearance consistent with age of record HENT: normocephalic; atraumatic Eyes: pupils equal, round and reactive to light; extraocular muscles intact Neck: supple Heart: regular rate and rhythm; no murmurs, rubs or gallops Lungs: clear to auscultation bilaterally Abdomen: soft; nondistended; nontender; no masses or  hepatosplenomegaly; bowel sounds present Extremities: No deformity; full range of motion; pulses normal; Tenderness over the left greater trochanter; Tenderness over the soft left medical thigh; No tenderness over ROM of the left knee. Neurologic: Awake, alert and oriented; motor function intact in all extremities and symmetric; no facial droop Skin: Warm and dry Psychiatric: Normal mood and affect    ED Course  Procedures (including critical care time)  COORDINATION OF CARE: 11:36 PM-Discussed treatment plan which includes X-Ray with pt at bedside and pt agreed to plan.    MDM  Nursing notes and vitals signs, including pulse oximetry, reviewed.  Summary of this visit's results, reviewed by myself:  Imaging Studies: Dg Hip Complete Left  07/14/2014   CLINICAL DATA:  Left hip pain, known greater trochanter fracture  EXAM: LEFT HIP - COMPLETE 2+ VIEW  COMPARISON:  CT pelvis dated 07/04/2014  FINDINGS: Nondisplaced fracture of the left greater trochanter, unchanged.  Degenerative changes of the bilateral hips, left greater than right.  Visualized bony pelvis appears intact.  Stable benign-appearing sclerotic lesion in the left proximal femur.  IMPRESSION: Nondisplaced fracture of the left greater trochanter, unchanged.   Electronically Signed   By: Julian Hy M.D.   On: 07/14/2014 23:41   Ct Hip Left Wo Contrast  07/15/2014   CLINICAL DATA:  Left hip pain. History of fracture trochanter 10 days ago.  EXAM: CT OF THE LEFT HIP WITHOUT CONTRAST  TECHNIQUE: Multidetector CT imaging of the left hip was performed according to the standard protocol. Multiplanar CT image reconstructions were also generated.  COMPARISON:  Left hip 07/14/2014.  CT pelvis 06/1914.  FINDINGS: Again demonstrated, there is a nondisplaced fracture of the left greater trochanter. No new fractures or significant change in the existing fracture are demonstrated. There are degenerative changes in the left hip with  narrowing and sclerosis in the superior acetabular joint and osteophytes on the femoral and acetabular surfaces. 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 Visualized left hemipelvis appears intact. Benign-appearing sclerosis in the proximal left femoral shaft. Soft tissues are unremarkable.  IMPRESSION: Nondisplaced fracture of the greater trochanter of the left hip. Degenerative changes in the left hip. No significant change since previous study.   Electronically Signed   By: Lucienne Capers M.D.   On: 07/15/2014 00:41   12:56 AM Pain improved with IM Dilaudid. No evidence of new or progressing fracture.   Wynetta Fines, MD 07/15/14 [phone]

## 2014-07-14 NOTE — ED Notes (Signed)
Pt states took Vicodin 7.5mg  at 9p

## 2014-07-14 NOTE — ED Notes (Signed)
Pt states dx with lt greater trochanter fx 10days ago, saw ortho yesterday, states may need surgery but not right now; states tonight getting into the bed when pulling lt leg over in beg, states felt a pop "feels like a rubber band came across to my groin area"; pt c/o pain to lt knee also, denies new injury.

## 2014-07-15 DIAGNOSIS — S72109A Unspecified trochanteric fracture of unspecified femur, initial encounter for closed fracture: Secondary | ICD-10-CM | POA: Diagnosis not present

## 2014-07-15 MED ORDER — HYDROCODONE-ACETAMINOPHEN 7.5-325 MG PO TABS: 1.0000 | ORAL_TABLET | ORAL | Status: DC | PRN

## 2015-11-30 IMAGING — CR DG LUMBAR SPINE COMPLETE 4+V
5 series · 5 of 5 positions shown · non-contrast
Comparison: None.

CLINICAL DATA: Low back and left hip pain following a fall last
night.

EXAM:
LUMBAR SPINE - COMPLETE 4+ VIEW

[t l-spine a.p.]
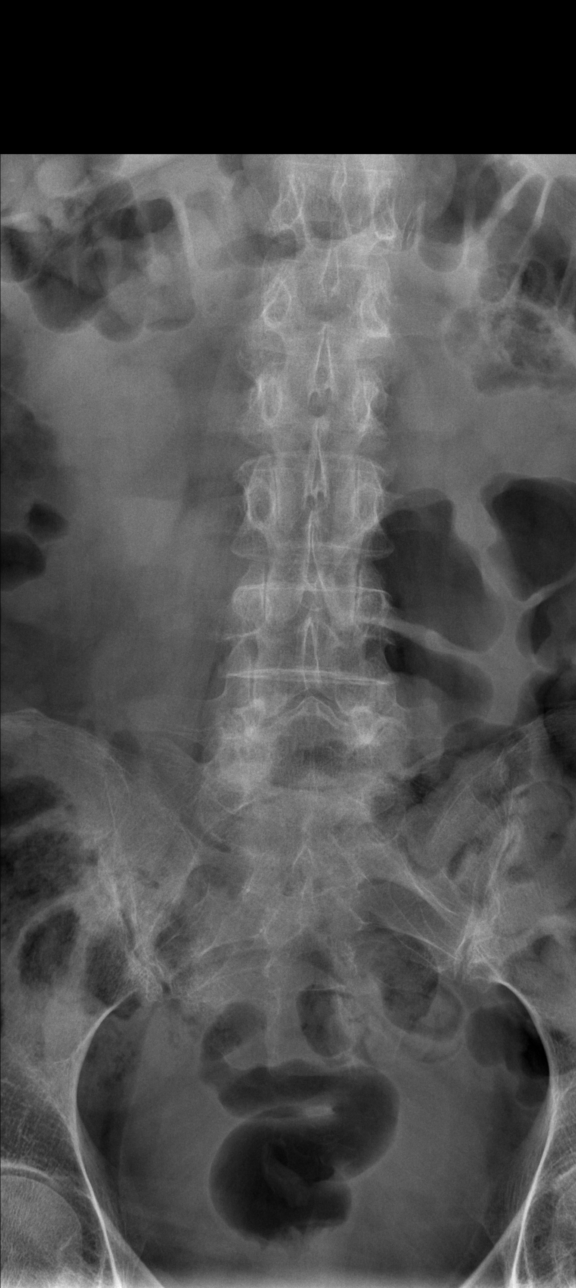

[t l-spine oblique exposure (1 of 2)]
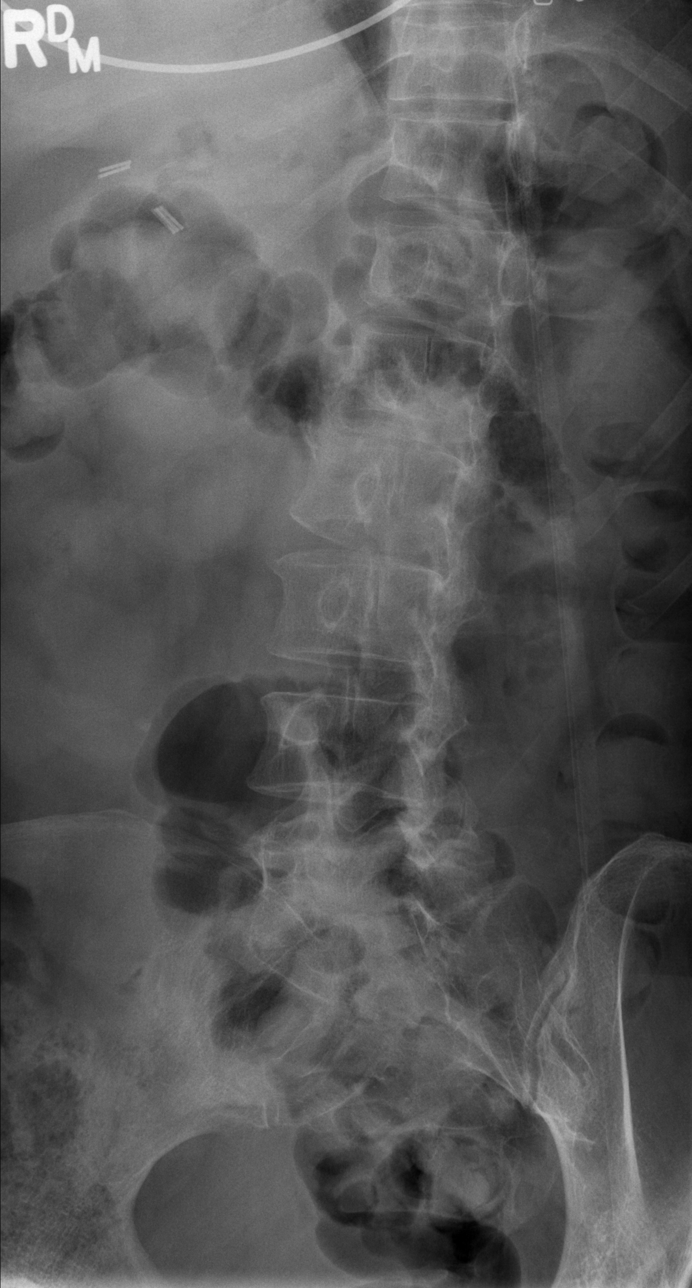

[t l-spine oblique exposure (2 of 2)]
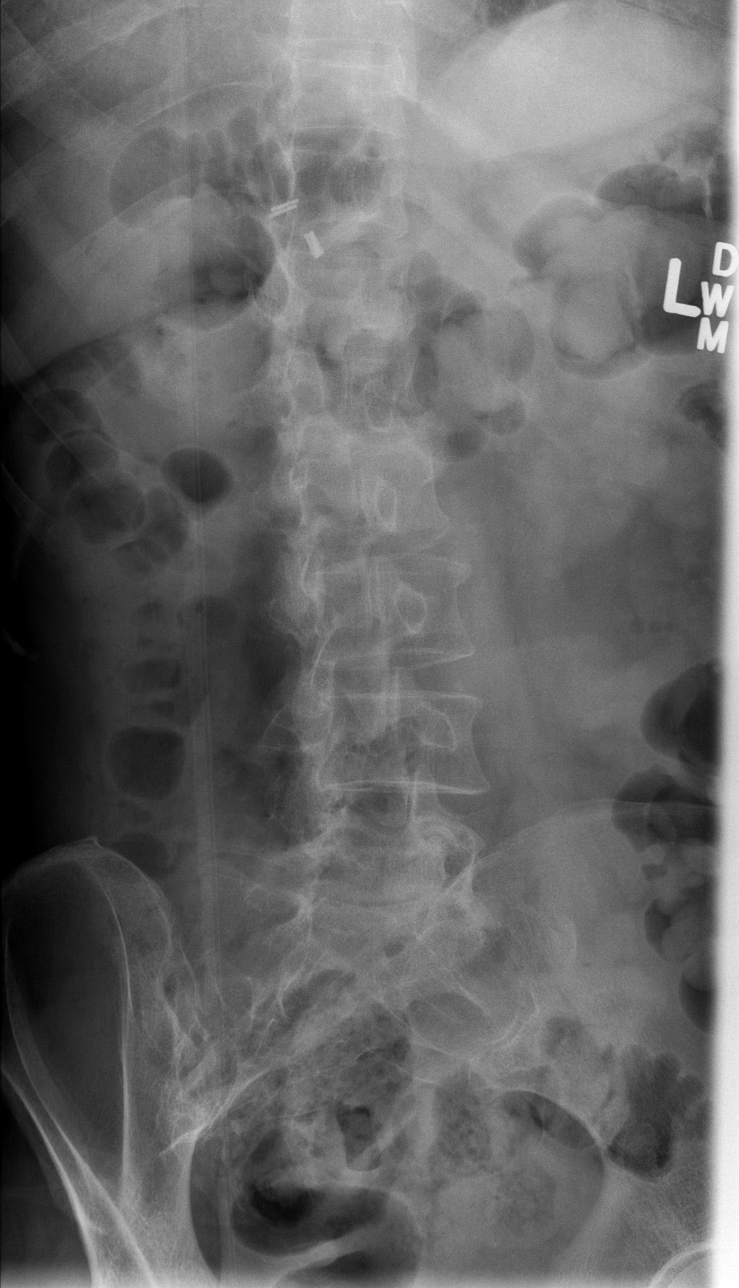

[t l-spine lat]
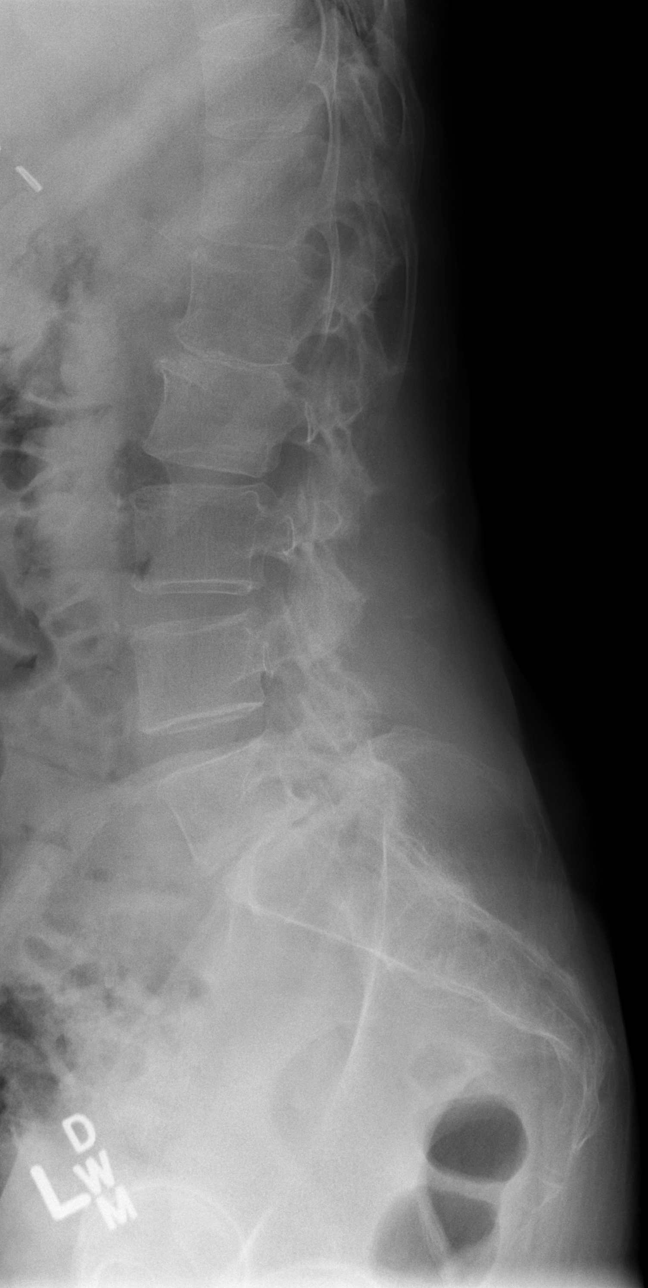

[t l-spine l5-s1 spot]
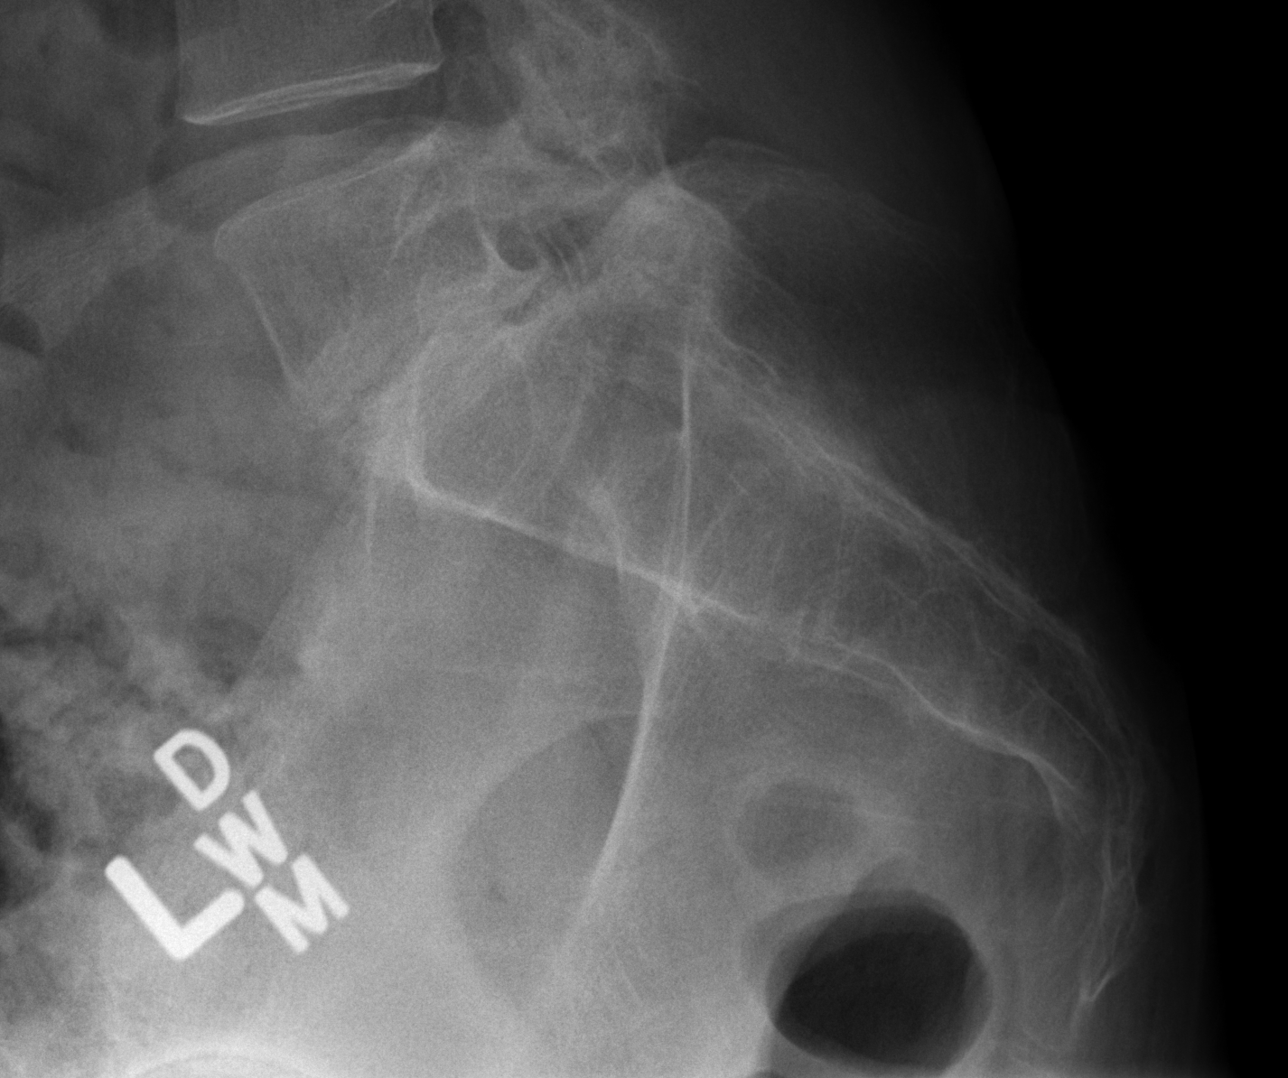

[5 of 5 positions shown; findings below may reference images not displayed]

FINDINGS: Five non-rib-bearing lumbar vertebrae. Marked disc space narrowing
at the L1-2 level with anterior and lateral spur formation. Marked
disc space narrowing with moderate posterior spur formation at the
L5-S1 level. Facet degenerative changes in the mid and lower lumbar
spine. No fractures, pars defects or subluxations are seen.
Cholecystectomy clips.
IMPRESSION: 1. No fracture or subluxation.
2. Degenerative changes, as described above.

## 2015-11-30 IMAGING — CR DG FEMUR 2V*L*
4 series · 4 of 4 positions shown · non-contrast
Comparison: None.

CLINICAL DATA: Fall. Proximal lateral left femur pain. Reported
history of benign tumor and proximal femur.

EXAM:
LEFT FEMUR - 2 VIEW

[t femur with hip  ap left]
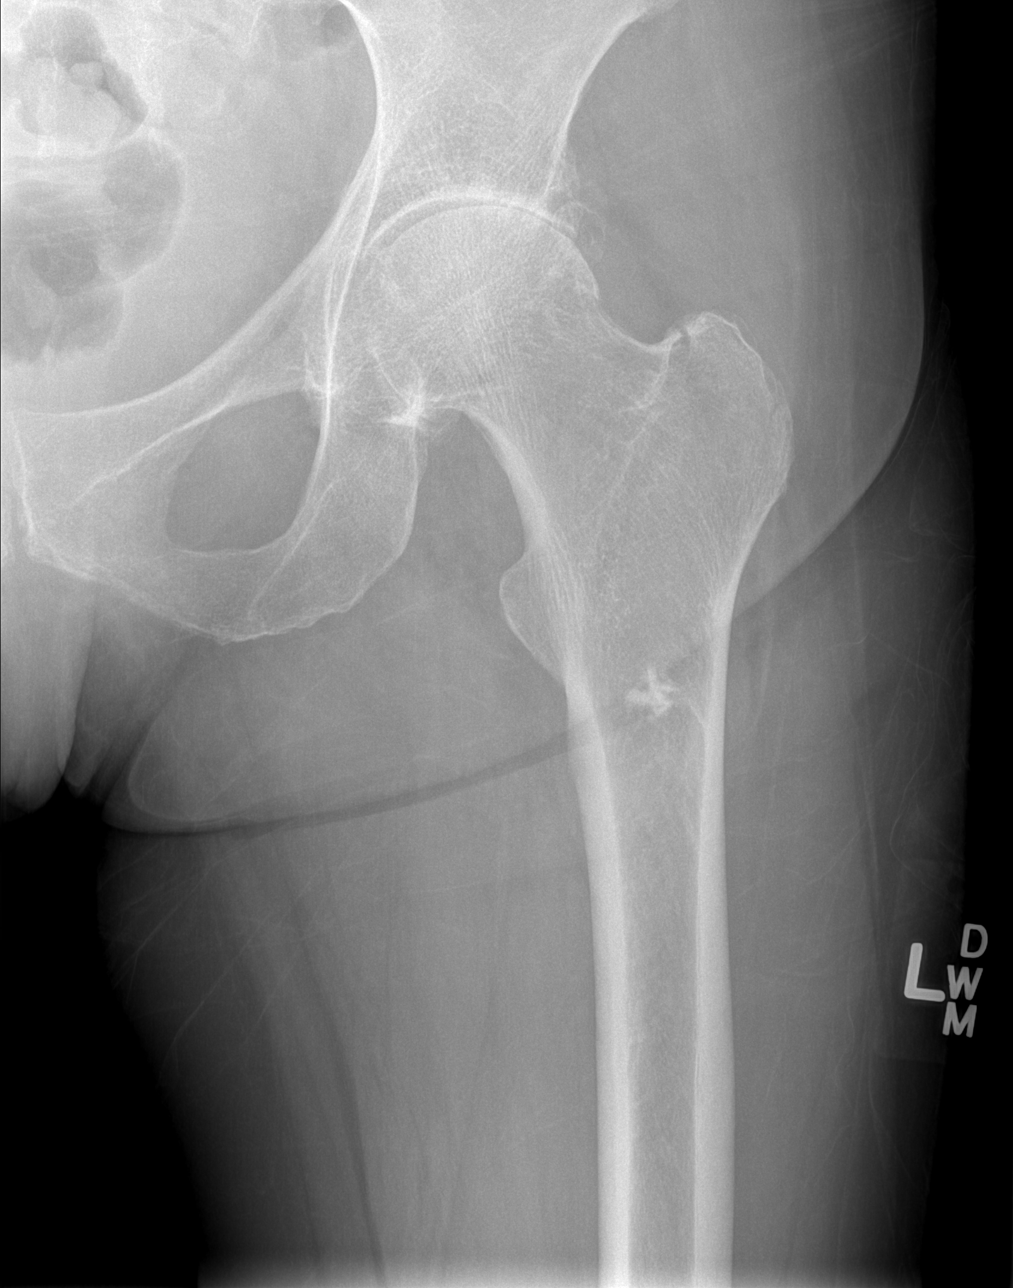

[t femur with knee ap left]
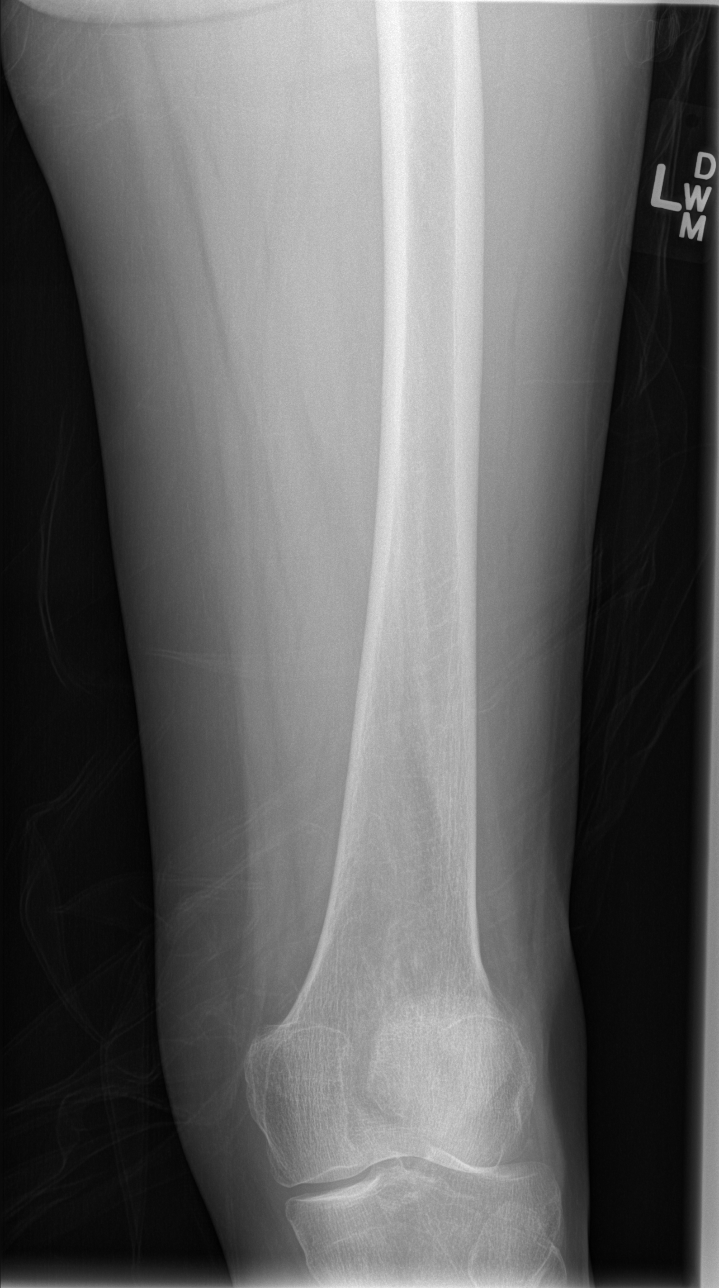

[t femur with hip lat left]
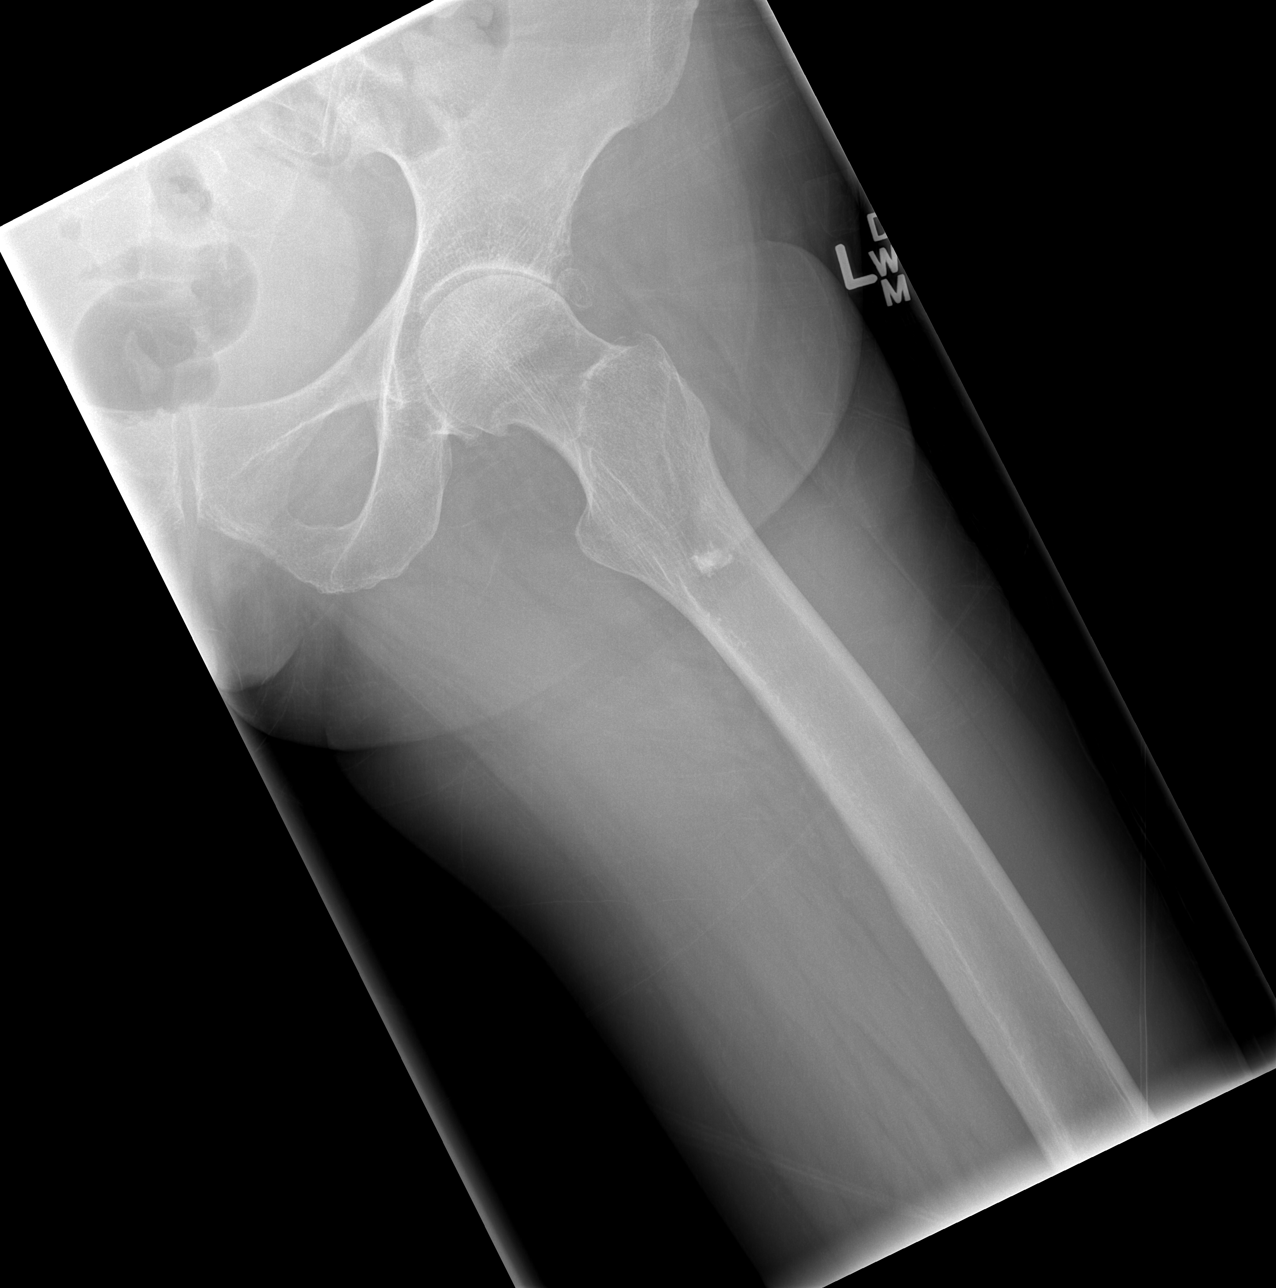

[t femur with knee lat left]
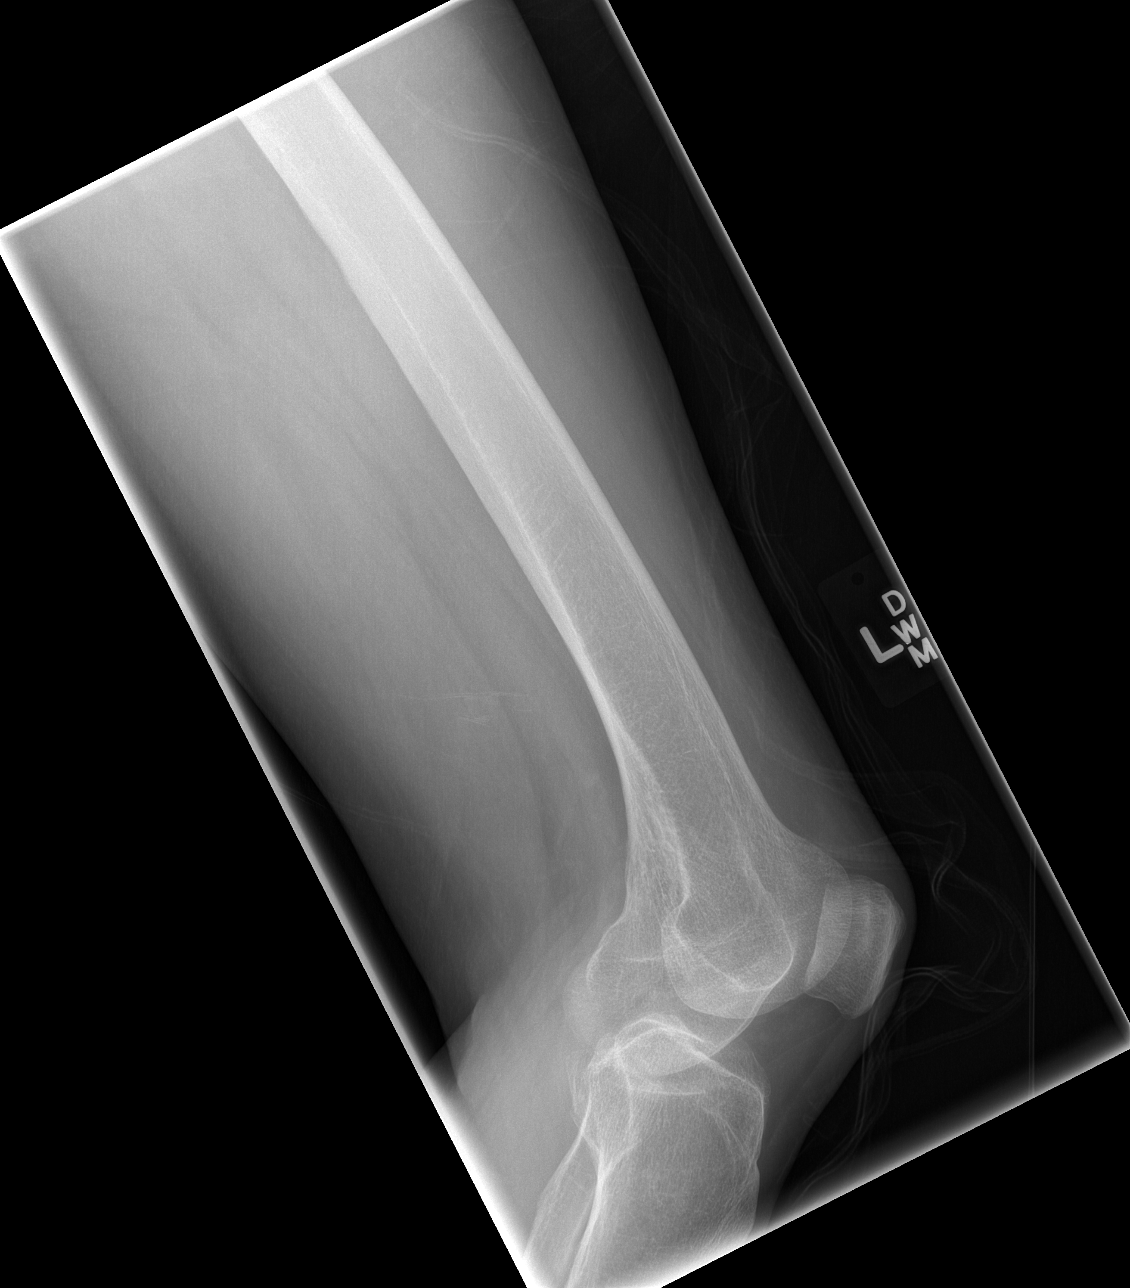

[4 of 4 positions shown; findings below may reference images not displayed]

FINDINGS: Left hip and knee are located. There is moderate left hip joint
space narrowing with marginal osteophyte formation and a well
corticated ossicle at the superolateral margin of the acetabulum
which may be developmental or related to prior trauma or
degenerative change.

There is subtle curvilinear lucency traversing cortex in the greater
trochanter, which could reflect nondisplaced fracture. There is a
1.3 cm sclerotic lesion with narrow zone of transition in the
proximal shaft of the left femur, likely benign. More distal femur
appears intact. No soft tissue abnormality is identified.
IMPRESSION: 1. Question nondisplaced fracture in the greater trochanter of the
left femur.
2. Moderate left hip osteoarthrosis.

## 2016-04-14 ENCOUNTER — Encounter (HOSPITAL_BASED_OUTPATIENT_CLINIC_OR_DEPARTMENT_OTHER): Payer: Self-pay

## 2016-04-14 DIAGNOSIS — S5782XA Crushing injury of left forearm, initial encounter: Secondary | ICD-10-CM | POA: Insufficient documentation

## 2016-04-14 DIAGNOSIS — E039 Hypothyroidism, unspecified: Secondary | ICD-10-CM | POA: Insufficient documentation

## 2016-04-14 DIAGNOSIS — W230XXA Caught, crushed, jammed, or pinched between moving objects, initial encounter: Secondary | ICD-10-CM | POA: Diagnosis not present

## 2016-04-14 DIAGNOSIS — Z87891 Personal history of nicotine dependence: Secondary | ICD-10-CM | POA: Diagnosis not present

## 2016-04-14 DIAGNOSIS — Y929 Unspecified place or not applicable: Secondary | ICD-10-CM | POA: Diagnosis not present

## 2016-04-14 DIAGNOSIS — Y999 Unspecified external cause status: Secondary | ICD-10-CM | POA: Insufficient documentation

## 2016-04-14 DIAGNOSIS — Y939 Activity, unspecified: Secondary | ICD-10-CM | POA: Insufficient documentation

## 2016-04-14 DIAGNOSIS — S4992XA Unspecified injury of left shoulder and upper arm, initial encounter: Secondary | ICD-10-CM | POA: Diagnosis present

## 2016-04-14 DIAGNOSIS — Z7982 Long term (current) use of aspirin: Secondary | ICD-10-CM | POA: Diagnosis not present

## 2016-04-14 NOTE — ED Notes (Signed)
Pt c/o left forearm pain after husband accidentally closed her arm in the door about 45 minutes ago, pt able to move fingers, distal pulses present, no deformity, swelling to middle of arm

## 2016-04-15 ENCOUNTER — Emergency Department (HOSPITAL_BASED_OUTPATIENT_CLINIC_OR_DEPARTMENT_OTHER)
Admission: EM | Admit: 2016-04-15 | Discharge: 2016-04-15 | Disposition: A | Discharge status: Home / Self Care | Payer: Managed Care, Other (non HMO) | Attending: Emergency Medicine | Admitting: Emergency Medicine

## 2016-04-15 ENCOUNTER — Emergency Department (HOSPITAL_BASED_OUTPATIENT_CLINIC_OR_DEPARTMENT_OTHER): Payer: Managed Care, Other (non HMO)

## 2016-04-15 DIAGNOSIS — S5782XA Crushing injury of left forearm, initial encounter: Secondary | ICD-10-CM

## 2016-04-15 HISTORY — DX: Systemic lupus erythematosus, unspecified: M32.9

## 2016-04-15 HISTORY — DX: Panic disorder (episodic paroxysmal anxiety): F41.0

## 2016-04-15 HISTORY — DX: Age-related osteoporosis without current pathological fracture: M81.0

## 2016-04-15 HISTORY — DX: Reserved for concepts with insufficient information to code with codable children: IMO0002

## 2016-04-15 MED ORDER — FENTANYL CITRATE (PF) 100 MCG/2ML IJ SOLN: 50.0000 ug | Freq: Once | INTRAMUSCULAR | Status: DC | PRN

## 2016-04-15 MED ORDER — HYDROCODONE-ACETAMINOPHEN 5-325 MG PO TABS: 1.0000 | ORAL_TABLET | Freq: Four times a day (QID) | ORAL | Status: AC | PRN

## 2016-04-15 MED ORDER — HYDROCODONE-ACETAMINOPHEN 5-325 MG PO TABS
1.0000 | ORAL_TABLET | Freq: Once | ORAL | Status: AC
  Administered 2016-04-15: 1 via ORAL
  Filled 2016-04-15: qty 1

## 2016-04-15 NOTE — ED Provider Notes (Signed)
CSN: [id number]     Arrival date & time 04/14/16  2316 History   First MD Initiated Contact with Patient 04/15/16 0124     Chief Complaint  Patient presents with  . Arm Injury     (Consider location/radiation/quality/duration/timing/severity/associated sxs/prior Treatment) HPI  This is a 60 year old female who got her mid left forearm accidentally slammed in her front door about 30 minutes prior to arrival. She is having moderate to severe pain in that arm which has gradually worsened since the injury. There is no associated deformity or numbness but movement of her hand or wrist exacerbates the pain.   Past Medical History  Diagnosis Date  . SVT (supraventricular tachycardia) ([place])   . Hypothyroid   . Chronic pain   . Osteoporosis   . Lupus ([place])   . Panic disorder    Past Surgical History  Procedure Laterality Date  . Lumbar laminectomy  1998    L5-S1  . Cervical fusion  2002   Family History  Problem Relation Age of Onset  . Hypertension Mother     Living, 25  . Arthritis Father     Living, 49  . Bowel Disease Sister     Died, 42 from SBO  . Crohn's disease Brother     Living, 55   Social History  Substance Use Topics  . Smoking status: Former Research scientist (life sciences)  . Smokeless tobacco: Never Used  . Alcohol Use: No   OB History    No data available     Review of Systems  All other systems reviewed and are negative.   Allergies  Neomycin-bacitracin zn-polymyx; Penicillins; Sulfonamide derivatives; and Oxycodone  Home Medications   Prior to Admission medications   Medication Sig Start Date End Date Taking? Authorizing Provider  ALPRAZolam Duanne Moron) 1 MG tablet Take 1 mg by mouth 3 (three) times daily as needed for anxiety.    Historical Provider, MD  aspirin 81 MG tablet Take 81 mg by mouth daily.    Historical Provider, MD  azithromycin (ZITHROMAX) 500 MG tablet Take by mouth daily.    Historical Provider, MD  bumetanide (BUMEX) 1 MG tablet Take 1 mg by mouth 2 (two)  times daily.    Historical Provider, MD  cyclobenzaprine (FLEXERIL) 10 MG tablet Take 10 mg by mouth daily.    Historical Provider, MD  HYDROcodone-acetaminophen (NORCO) 7.5-325 MG per tablet Take 1 tablet by mouth every 4 (four) hours as needed (for pain). 07/15/14   Avigayil Ton, MD  HYDROcodone-acetaminophen (NORCO/VICODIN) 5-325 MG per tablet Take 1-2 tablets by mouth every 4 (four) hours as needed. 07/04/14   Charlann Lange, PA-C  levothyroxine (SYNTHROID, LEVOTHROID) 112 MCG tablet Take 112 mcg by mouth daily before breakfast.    Historical Provider, MD  loratadine (CLARITIN) 10 MG tablet Take 10 mg by mouth daily.    Historical Provider, MD  omeprazole (PRILOSEC) 20 MG capsule Take 20 mg by mouth 2 (two) times daily before a meal.    Historical Provider, MD  pregabalin (LYRICA) 200 MG capsule Take 200 mg by mouth 2 (two) times daily.    Historical Provider, MD   BP 140/91 mmHg  Pulse 71  Temp(Src) 98.7 F (37.1 C) (Oral)  Resp 18  Ht 5\' 5"  (1.651 m)  Wt 129 lb (58.514 kg)  BMI 21.47 kg/m2  SpO2 100%   Physical Exam General: Well-developed, well-nourished female in no acute distress; appearance consistent with age of record HENT: normocephalic; atraumatic Eyes: pupils equal, round and reactive to  light; extraocular muscles intact Neck: supple Heart: regular rate and rhythm Lungs: clear to auscultation bilaterally Abdomen: soft; nondistended; nontender; bowel sounds present Extremities: No deformity; full range of motion except left wrist and fingers due to pain; pulses normal; tenderness, mild swelling and mild ecchymosis of the left forearm; left hand distally neurovascularly intact with intact tendon function Neurologic: Awake, alert and oriented; motor function intact in all extremities and symmetric; no facial droop Skin: Warm and dry Psychiatric: Normal mood and affect    ED Course  Procedures (including critical care time)   MDM  Nursing notes and vitals signs,  including pulse oximetry, reviewed.  Summary of this visit's results, reviewed by myself:  Imaging Studies: Dg Forearm Left  04/15/2016  CLINICAL DATA:  Pain after slamming forearm in a door. EXAM: LEFT FOREARM - 2 VIEW COMPARISON:  None. FINDINGS: There is no evidence of fracture or other focal bone lesions. Soft tissues are unremarkable. IMPRESSION: Negative. Electronically Signed   By: Andreas Newport M.D.   On: 04/15/2016 00:43      Shanon Rosser, MD 04/15/16 [phone]
